# Patient Record
Sex: Female | Born: 1985 | Race: Black or African American | Hispanic: No | Marital: Married | Smoking: Never smoker
Health system: Southern US, Community
[De-identification: ages and names within clinical notes are randomized; demographics above are authoritative.]

## PROBLEM LIST (undated history)

## (undated) DIAGNOSIS — F419 Anxiety disorder, unspecified: Secondary | ICD-10-CM

## (undated) HISTORY — PX: ABDOMINAL HYSTERECTOMY: SHX81

---

## 2015-02-27 ENCOUNTER — Emergency Department: Payer: Self-pay | Admitting: Emergency Medicine

## 2015-04-21 ENCOUNTER — Emergency Department
Admission: EM | Admit: 2015-04-21 | Discharge: 2015-04-21 | Disposition: A | Discharge status: Home / Self Care | Attending: Emergency Medicine | Admitting: Emergency Medicine

## 2015-04-21 ENCOUNTER — Emergency Department

## 2015-04-21 ENCOUNTER — Encounter: Payer: Self-pay | Admitting: *Deleted

## 2015-04-21 DIAGNOSIS — K5900 Constipation, unspecified: Secondary | ICD-10-CM | POA: Insufficient documentation

## 2015-04-21 HISTORY — DX: Anxiety disorder, unspecified: F41.9

## 2015-04-21 LAB — COMPREHENSIVE METABOLIC PANEL
ALBUMIN: 4.2 g/dL (ref 3.5–5.0)
ALK PHOS: 71 U/L (ref 38–126)
ALT: 35 U/L (ref 14–54)
ANION GAP: 6 (ref 5–15)
AST: 43 U/L — ABNORMAL HIGH (ref 15–41)
BILIRUBIN TOTAL: 0.9 mg/dL (ref 0.3–1.2)
BUN: 7 mg/dL (ref 6–20)
CO2: 24 mmol/L (ref 22–32)
Calcium: 8.8 mg/dL — ABNORMAL LOW (ref 8.9–10.3)
Chloride: 108 mmol/L (ref 101–111)
Creatinine, Ser: 0.79 mg/dL (ref 0.44–1.00)
Glucose, Bld: 112 mg/dL — ABNORMAL HIGH (ref 65–99)
POTASSIUM: 3.7 mmol/L (ref 3.5–5.1)
Sodium: 138 mmol/L (ref 135–145)
Total Protein: 8 g/dL (ref 6.5–8.1)

## 2015-04-21 LAB — CBC WITH DIFFERENTIAL/PLATELET
Basophils Absolute: 0 10*3/uL (ref 0–0.1)
Basophils Relative: 1 %
EOS ABS: 0.1 10*3/uL (ref 0–0.7)
EOS PCT: 2 %
HCT: 38.2 % (ref 35.0–47.0)
HEMOGLOBIN: 12.6 g/dL (ref 12.0–16.0)
LYMPHS ABS: 2.8 10*3/uL (ref 1.0–3.6)
Lymphocytes Relative: 51 %
MCH: 27.9 pg (ref 26.0–34.0)
MCHC: 32.9 g/dL (ref 32.0–36.0)
MCV: 84.6 fL (ref 80.0–100.0)
MONOS PCT: 10 %
Monocytes Absolute: 0.6 10*3/uL (ref 0.2–0.9)
Neutro Abs: 2 10*3/uL (ref 1.4–6.5)
Neutrophils Relative %: 36 %
Platelets: 242 10*3/uL (ref 150–440)
RBC: 4.52 MIL/uL (ref 3.80–5.20)
RDW: 14 % (ref 11.5–14.5)
WBC: 5.6 10*3/uL (ref 3.6–11.0)

## 2015-04-21 LAB — URINALYSIS COMPLETE WITH MICROSCOPIC (ARMC ONLY)
BILIRUBIN URINE: NEGATIVE
Glucose, UA: NEGATIVE mg/dL
Hgb urine dipstick: NEGATIVE
Ketones, ur: NEGATIVE mg/dL
Leukocytes, UA: NEGATIVE
Nitrite: NEGATIVE
Protein, ur: NEGATIVE mg/dL
SPECIFIC GRAVITY, URINE: 1.018 (ref 1.005–1.030)
WBC, UA: NONE SEEN WBC/hpf (ref 0–5)
pH: 6 (ref 5.0–8.0)

## 2015-04-21 MED ORDER — MAGNESIUM CITRATE PO SOLN: 1.0000 | Freq: Once | ORAL | Status: DC

## 2015-04-21 NOTE — ED Notes (Signed)
Pt reports she has had some constipation over the past week, associated with vomiting. Today she noticed dark red blood in her stool

## 2015-04-21 NOTE — ED Provider Notes (Signed)
[facility] Emergency Department Provider Note   ____________________________________________  Time seen: 2140  I have reviewed the triage vital signs and the nursing notes.   HISTORY  Chief Complaint Constipation   History limited by: Not Limited   HPI Bethann PunchesJessica D Sylla is a 29 y.o. female resents to the emergency department because of concerns for constipation. She states she has been constipated for the past week. She has been able to pass very small stool. She states that her small stool yesterday had some blood on it. She also has had some nausea and some vomiting. She denies any fevers. She states she has had some issues with constipation and slow bowel in the past but usually is able to control this with supplements.     Past Medical History  Diagnosis Date  . Anxiety     There are no active problems to display for this patient.   Past Surgical History  Procedure Laterality Date  . Abdominal hysterectomy      Current Outpatient Rx  Name  Route  Sig  Dispense  Refill  . magnesium citrate SOLN   Oral   Take 296 mLs (1 Bottle total) by mouth once.   195 mL   0     Allergies Review of patient's allergies indicates no known allergies.  No family history on file.  Social History History  Substance Use Topics  . Smoking status: Never Smoker   . Smokeless tobacco: Not on file  . Alcohol Use: No    Review of Systems  Constitutional: Negative for fever. Cardiovascular: Negative for chest pain. Respiratory: Negative for shortness of breath. Gastrointestinal: Positive for abdominal pain and constipation Genitourinary: Negative for dysuria. Musculoskeletal: Negative for back pain. Skin: Negative for rash. Neurological: Negative for headaches, focal weakness or numbness.   10-point ROS otherwise negative.  ____________________________________________   PHYSICAL EXAM:  VITAL SIGNS: ED Triage Vitals  Enc Vitals Group     BP 04/21/15 1816 123/74 mmHg     Pulse Rate 04/21/15 1816 83     Resp 04/21/15 1816 18     Temp 04/21/15 1816 97.6 F (36.4 C)     Temp Source 04/21/15 1816 Oral     SpO2 04/21/15 1816 99 %     Weight 04/21/15 1816 189 lb (85.73 kg)     Height 04/21/15 1816 5\' 8"  (1.727 m)     Head Cir --      Peak Flow --      Pain Score --      Pain Loc --      Pain Edu? --      Excl. in GC? --      Constitutional: Alert and oriented. Well appearing and in no distress. Eyes: Conjunctivae are normal. PERRL. Normal extraocular movements. ENT   Head: Normocephalic and atraumatic.   Nose: No congestion/rhinnorhea.   Mouth/Throat: Mucous membranes are moist.   Neck: No stridor. Hematological/Lymphatic/Immunilogical: No cervical lymphadenopathy. Cardiovascular: Normal rate, regular rhythm.  No murmurs, rubs, or gallops. Respiratory: Normal respiratory effort without tachypnea nor retractions. Breath sounds are clear and equal bilaterally. No wheezes/rales/rhonchi. Gastrointestinal: Soft and nontender. No distention. There is no CVA tenderness. Rectal exam without any impaction. No gross blood on glove. Genitourinary: Deferred Musculoskeletal: Normal range of motion in all extremities. No joint effusions.  No lower extremity tenderness nor edema. Neurologic:  Normal speech and language. No gross focal neurologic deficits are appreciated. Speech is normal.  Skin:  Skin is warm, dry  and intact. No rash noted. Psychiatric: Mood and affect are normal. Speech and behavior are normal. Patient exhibits appropriate insight and judgment.  ____________________________________________    LABS (pertinent positives/negatives)  Labs Reviewed  COMPREHENSIVE METABOLIC PANEL - Abnormal; Notable for the following:    Glucose, Bld 112 (*)    Calcium 8.8 (*)    AST 43 (*)    All other components within normal limits  URINALYSIS COMPLETEWITH MICROSCOPIC ([facility])  - Abnormal; Notable for the  following:    Color, Urine YELLOW (*)    APPearance CLEAR (*)    Bacteria, UA RARE (*)    Squamous Epithelial / LPF 0-5 (*)    All other components within normal limits  CBC WITH DIFFERENTIAL/PLATELET     ____________________________________________   EKG  None  ____________________________________________    RADIOLOGY  Abd x-ray IMPRESSION: Negative.  ____________________________________________   PROCEDURES  Procedure(s) performed: None  Critical Care performed: No  ____________________________________________   INITIAL IMPRESSION / ASSESSMENT AND PLAN / ED COURSE  Pertinent labs & imaging results that were available during my care of the patient were reviewed by me and considered in my medical decision making (see chart for details).  Patient presents to the emergency department because of concerns for constipation and some bloody stool. On exam patient well-appearing. Abdomen nontender and soft. Rectal exam without any obvious impaction. No gross blood on glove.  Discussed with the patient constipation treatment. Will give prescription for mag citrate and instructed patient she can also use enemas and to use MiraLAX. Discussed roll of fiber.  ____________________________________________   FINAL CLINICAL IMPRESSION(S) / ED DIAGNOSES  Final diagnoses:  Constipation, unspecified constipation type     Phineas SemenGraydon Liylah Najarro, MD 04/21/15 2242

## 2015-04-21 NOTE — Discharge Instructions (Signed)
Please seek medical attention for any high fevers, chest pain, shortness of breath, change in behavior, persistent vomiting, bloody stool or any other new or concerning symptoms.   Constipation Constipation is when a person:  Poops (has a bowel movement) less than 3 times a week.  Has a hard time pooping.  Has poop that is dry, hard, or bigger than normal. HOME CARE   Eat foods with a lot of fiber in them. This includes fruits, vegetables, beans, and whole grains such as brown rice.  Avoid fatty foods and foods with a lot of sugar. This includes french fries, hamburgers, cookies, candy, and soda.  If you are not getting enough fiber from food, take products with added fiber in them (supplements).  Drink enough fluid to keep your pee (urine) clear or pale yellow.  Exercise on a regular basis, or as told by your doctor.  Go to the restroom when you feel like you need to poop. Do not hold it.  Only take medicine as told by your doctor. Do not take medicines that help you poop (laxatives) without talking to your doctor first. GET HELP RIGHT AWAY IF:   You have bright red blood in your poop (stool).  Your constipation lasts more than 4 days or gets worse.  You have belly (abdominal) or butt (rectal) pain.  You have thin poop (as thin as a pencil).  You lose weight, and it cannot be explained. MAKE SURE YOU:   Understand these instructions.  Will watch your condition.  Will get help right away if you are not doing well or get worse. Document Released: 05/16/2008 Document Revised: 12/03/2013 Document Reviewed: 09/09/2013 [facility] Patient Information 2015 [place]. This information is not intended to replace advice given to you by your health care provider. Make sure you discuss any questions you have with your health care provider.

## 2015-04-21 NOTE — ED Notes (Signed)
Pt c/o constipation for one week. Has tried laxatives at home and has not had any relief.

## 2015-06-29 ENCOUNTER — Other Ambulatory Visit: Payer: Self-pay | Admitting: Nurse Practitioner

## 2015-06-29 DIAGNOSIS — R1031 Right lower quadrant pain: Principal | ICD-10-CM

## 2015-06-29 DIAGNOSIS — R1032 Left lower quadrant pain: Principal | ICD-10-CM

## 2015-06-29 DIAGNOSIS — G8929 Other chronic pain: Secondary | ICD-10-CM

## 2015-07-02 ENCOUNTER — Ambulatory Visit

## 2015-07-06 ENCOUNTER — Ambulatory Visit: Admission: RE | Admit: 2015-07-06 | Source: Ambulatory Visit

## 2015-09-12 ENCOUNTER — Emergency Department
Admission: EM | Admit: 2015-09-12 | Discharge: 2015-09-12 | Disposition: A | Discharge status: Home / Self Care | Attending: Emergency Medicine | Admitting: Emergency Medicine

## 2015-09-12 ENCOUNTER — Encounter: Payer: Self-pay | Admitting: Emergency Medicine

## 2015-09-12 DIAGNOSIS — F410 Panic disorder [episodic paroxysmal anxiety] without agoraphobia: Secondary | ICD-10-CM | POA: Insufficient documentation

## 2015-09-12 DIAGNOSIS — F419 Anxiety disorder, unspecified: Secondary | ICD-10-CM

## 2015-09-12 DIAGNOSIS — R079 Chest pain, unspecified: Secondary | ICD-10-CM | POA: Diagnosis present

## 2015-09-12 DIAGNOSIS — Z79899 Other long term (current) drug therapy: Secondary | ICD-10-CM | POA: Insufficient documentation

## 2015-09-12 DIAGNOSIS — F430 Acute stress reaction: Secondary | ICD-10-CM

## 2015-09-12 LAB — CBC
HCT: 38.1 % (ref 35.0–47.0)
Hemoglobin: 12.6 g/dL (ref 12.0–16.0)
MCH: 28 pg (ref 26.0–34.0)
MCHC: 33.2 g/dL (ref 32.0–36.0)
MCV: 84.5 fL (ref 80.0–100.0)
PLATELETS: 278 10*3/uL (ref 150–440)
RBC: 4.51 MIL/uL (ref 3.80–5.20)
RDW: 13.7 % (ref 11.5–14.5)
WBC: 4.2 10*3/uL (ref 3.6–11.0)

## 2015-09-12 LAB — TROPONIN I

## 2015-09-12 LAB — BASIC METABOLIC PANEL
Anion gap: 4 — ABNORMAL LOW (ref 5–15)
BUN: 9 mg/dL (ref 6–20)
CALCIUM: 8.9 mg/dL (ref 8.9–10.3)
CO2: 24 mmol/L (ref 22–32)
CREATININE: 0.78 mg/dL (ref 0.44–1.00)
Chloride: 108 mmol/L (ref 101–111)
GFR calc Af Amer: >60 mL/min (ref >60)
GLUCOSE: 99 mg/dL (ref 65–99)
Potassium: 4 mmol/L (ref 3.5–5.1)
Sodium: 136 mmol/L (ref 135–145)

## 2015-09-12 MED ORDER — HYDROXYZINE HCL 50 MG PO TABS: 50.0000 mg | ORAL_TABLET | Freq: Every evening | ORAL | Status: AC | PRN

## 2015-09-12 NOTE — ED Notes (Signed)
Pt reports anxiety. States she feel that her thoughts are racing and feels she is in a "haze". Pt states she is having random crying bouts. No acute distress noted at present. Husband present at bedside.

## 2015-09-12 NOTE — ED Notes (Signed)
Pt states approx 1 week she has been having intermittent chest/back pain, as well as increased anxiety. Pt calm and cooperative in triage. NAD noted at this time.

## 2015-09-12 NOTE — ED Provider Notes (Signed)
[facility] Emergency Department Provider Note  ____________________________________________  Time seen: Approximately 1:29 PM  I have reviewed the triage vital signs and the nursing notes.   HISTORY  Chief Complaint Anxiety and Chest Pain    HPI Ashlee Estrada is a 29 y.o. female resistance the emergency department complaining of chest/back pain, increased respiratory rate, increased anxiety. Upon interview with the provider the patient said that most of her symptoms had subsided. She states that she has had intermittent history of same symptoms over the past week to 10 days. She endorses a history of anxiety was treated with Cymbalta but states that she self discontinued medication approximately 30 days ago. She states that her symptoms have been manageable to this time. However, she states that she has a known trigger that is present in her social life that is causing increase in anxiety. She states that she has an appointment to follow-up with her primary care provider but the symptoms were increasing to the point that she "needed help at this time." Patient states that the chest pain is described best as a tightness sensation.   Past Medical History  Diagnosis Date  . Anxiety     There are no active problems to display for this patient.   Past Surgical History  Procedure Laterality Date  . Abdominal hysterectomy      Current Outpatient Rx  Name  Route  Sig  Dispense  Refill  . hydrOXYzine (ATARAX/VISTARIL) 50 MG tablet   Oral   Take 1 tablet (50 mg total) by mouth at bedtime as needed for anxiety.   10 tablet   0   . magnesium citrate SOLN   Oral   Take 296 mLs (1 Bottle total) by mouth once.   195 mL   0     Allergies Review of patient's allergies indicates no known allergies.  History reviewed. No pertinent family history.  Social History Social History  Substance Use Topics  . Smoking status: Never Smoker   . Smokeless  tobacco: None  . Alcohol Use: No    Review of Systems Constitutional: No fever/chills Eyes: No visual changes. ENT: No sore throat. Cardiovascular: Endorses chest pain/tightness. Respiratory: Denies shortness of breath. She endorses increased respiratory rate. Gastrointestinal: No abdominal pain.  No nausea, no vomiting.  No diarrhea.  No constipation. Genitourinary: Negative for dysuria. Musculoskeletal: Negative for back pain. Skin: Negative for rash. Neurological: Negative for headaches, focal weakness or numbness.  10-point ROS otherwise negative.  ____________________________________________   PHYSICAL EXAM:  VITAL SIGNS: ED Triage Vitals  Enc Vitals Group     BP 09/12/15 1037 107/88 mmHg     Pulse Rate 09/12/15 1037 78     Resp 09/12/15 1037 18     Temp 09/12/15 1037 98.3 F (36.8 C)     Temp Source 09/12/15 1037 Oral     SpO2 09/12/15 1037 95 %     Weight 09/12/15 1037 179 lb (81.194 kg)     Height 09/12/15 1037  (1.727 m)     Head Cir --      Peak Flow --      Pain Score 09/12/15 1038 8     Pain Loc --      Pain Edu? --      Excl. in GC? --     Constitutional: Alert and oriented. Well appearing and in no acute distress. Eyes: Conjunctivae are normal. PERRL. EOMI. Head: Atraumatic. Nose: No congestion/rhinnorhea. Mouth/Throat: Mucous membranes are moist.  Oropharynx non-erythematous. Neck: No stridor.   Cardiovascular: Normal rate, regular rhythm. Grossly normal heart sounds.  Good peripheral circulation. Respiratory: Normal respiratory effort.  No retractions. Lungs CTAB. Gastrointestinal: Soft and nontender. No distention. No abdominal bruits. No CVA tenderness. Musculoskeletal: No lower extremity tenderness nor edema.  No joint effusions. Neurologic:  Normal speech and language. No gross focal neurologic deficits are appreciated. No gait instability. Skin:  Skin is warm, dry and intact. No rash noted. Psychiatric: Mood and affect are normal.  Speech and behavior are normal.  ____________________________________________   LABS (all labs ordered are listed, but only abnormal results are displayed)  Labs Reviewed  BASIC METABOLIC PANEL - Abnormal; Notable for the following:    Anion gap 4 (*)    All other components within normal limits  CBC  TROPONIN I   ____________________________________________  EKG  EKG: Normal sinus rhythm. No elevations or depressions noted. PR, QRS, QT intervals within normal limits. No Q waves or delta waves present. ____________________________________________  RADIOLOGY   ____________________________________________   PROCEDURES  Procedure(s) performed: None  Critical Care performed: No  ____________________________________________   INITIAL IMPRESSION / ASSESSMENT AND PLAN / ED COURSE  Pertinent labs & imaging results that were available during my care of the patient were reviewed by me and considered in my medical decision making (see chart for details).  The patient is a 29 year old female who presented to the emergency department with chest pain, tachypnea, and anxiety. A timeout provider saw the patient also symptoms had resolved. Laboratory, EKG, and exam findings are most consistent with a panic attack. Advised patient of diagnosis. She agrees with same. Patient advises that hydroxyzine was given the last time she had these increased symptoms that were worked well for her. She does not want to restart her Cymbalta until she sees her primary care provider. He also declines Ativan. We'll prescribe hydroxyzine until patient can see primary care appointment within the next week. Patient understands diagnosis and treatment plan. Verbalizes consent. Verbalizes compliance. ____________________________________________   FINAL CLINICAL IMPRESSION(S) / ED DIAGNOSES  Final diagnoses:  Panic attack as reaction to stress  Anxiety      Racheal Patches, PA-C 09/12/15  1603  Emily Filbert, MD 09/13/15 1540

## 2015-09-12 NOTE — ED Notes (Signed)
NAD noted at time of D/C. Pt denies questions or concerns. Pt ambulatory to the lobby at this time.  

## 2015-09-12 NOTE — Discharge Instructions (Signed)

## 2015-11-17 ENCOUNTER — Encounter: Payer: Self-pay | Admitting: Emergency Medicine

## 2015-11-17 DIAGNOSIS — K8020 Calculus of gallbladder without cholecystitis without obstruction: Secondary | ICD-10-CM | POA: Insufficient documentation

## 2015-11-17 DIAGNOSIS — Z79899 Other long term (current) drug therapy: Secondary | ICD-10-CM | POA: Insufficient documentation

## 2015-11-17 DIAGNOSIS — Z3202 Encounter for pregnancy test, result negative: Secondary | ICD-10-CM | POA: Insufficient documentation

## 2015-11-17 DIAGNOSIS — R109 Unspecified abdominal pain: Secondary | ICD-10-CM | POA: Diagnosis present

## 2015-11-17 LAB — CBC
HCT: 37.1 % (ref 35.0–47.0)
Hemoglobin: 12.1 g/dL (ref 12.0–16.0)
MCH: 27.5 pg (ref 26.0–34.0)
MCHC: 32.6 g/dL (ref 32.0–36.0)
MCV: 84.6 fL (ref 80.0–100.0)
PLATELETS: 254 10*3/uL (ref 150–440)
RBC: 4.39 MIL/uL (ref 3.80–5.20)
RDW: 14.3 % (ref 11.5–14.5)
WBC: 6.4 10*3/uL (ref 3.6–11.0)

## 2015-11-17 LAB — COMPREHENSIVE METABOLIC PANEL
ALT: 16 U/L (ref 14–54)
AST: 21 U/L (ref 15–41)
Albumin: 4.2 g/dL (ref 3.5–5.0)
Alkaline Phosphatase: 65 U/L (ref 38–126)
Anion gap: 4 — ABNORMAL LOW (ref 5–15)
BUN: 8 mg/dL (ref 6–20)
CO2: 26 mmol/L (ref 22–32)
CREATININE: 0.83 mg/dL (ref 0.44–1.00)
Calcium: 8.9 mg/dL (ref 8.9–10.3)
Chloride: 107 mmol/L (ref 101–111)
GFR calc Af Amer: >60 mL/min (ref >60)
Glucose, Bld: 99 mg/dL (ref 65–99)
Potassium: 3.8 mmol/L (ref 3.5–5.1)
Sodium: 137 mmol/L (ref 135–145)
TOTAL PROTEIN: 7.6 g/dL (ref 6.5–8.1)
Total Bilirubin: 0.4 mg/dL (ref 0.3–1.2)

## 2015-11-17 LAB — URINALYSIS COMPLETE WITH MICROSCOPIC (ARMC ONLY)
BILIRUBIN URINE: NEGATIVE
Glucose, UA: NEGATIVE mg/dL
HGB URINE DIPSTICK: NEGATIVE
KETONES UR: NEGATIVE mg/dL
LEUKOCYTES UA: NEGATIVE
NITRITE: NEGATIVE
PH: 5 (ref 5.0–8.0)
Protein, ur: NEGATIVE mg/dL
RBC / HPF: NONE SEEN RBC/hpf (ref 0–5)
Specific Gravity, Urine: 1.015 (ref 1.005–1.030)

## 2015-11-17 LAB — POCT PREGNANCY, URINE: PREG TEST UR: NEGATIVE

## 2015-11-17 LAB — LIPASE, BLOOD: Lipase: 35 U/L (ref 11–51)

## 2015-11-17 NOTE — ED Notes (Signed)
Pt presents to ED with c/o left flank pain since yesterday with nausea and feels bloated. Last bowl movements yesterday. No urinary symptoms. Pt alerts and oriented x4 at this time.

## 2015-11-18 ENCOUNTER — Emergency Department
Admission: EM | Admit: 2015-11-18 | Discharge: 2015-11-18 | Disposition: A | Discharge status: Home / Self Care | Attending: Emergency Medicine | Admitting: Emergency Medicine

## 2015-11-18 ENCOUNTER — Emergency Department

## 2015-11-18 DIAGNOSIS — K8020 Calculus of gallbladder without cholecystitis without obstruction: Secondary | ICD-10-CM

## 2015-11-18 DIAGNOSIS — R109 Unspecified abdominal pain: Secondary | ICD-10-CM

## 2015-11-18 MED ORDER — ONDANSETRON HCL 4 MG/2ML IJ SOLN
4.0000 mg | Freq: Once | INTRAMUSCULAR | Status: AC
  Administered 2015-11-18: 4 mg via INTRAVENOUS
  Filled 2015-11-18: qty 2

## 2015-11-18 MED ORDER — KETOROLAC TROMETHAMINE 30 MG/ML IJ SOLN
30.0000 mg | Freq: Once | INTRAMUSCULAR | Status: AC
  Administered 2015-11-18: 30 mg via INTRAVENOUS
  Filled 2015-11-18: qty 1

## 2015-11-18 NOTE — ED Notes (Signed)
Discharge instructions given to pt.  Verbalized understanding.  No questions or concerns at this time.  Pt in NAD.  Items with pt upon discharge.  No items left in room.

## 2015-11-18 NOTE — ED Provider Notes (Signed)
[facility] Provider Note  ____________________________________________  Time seen: 12:15AM  I have reviewed the triage vital signs and the nursing notes.   HISTORY  Chief Complaint Abdominal Pain     HPI Ashlee Estrada is a 29 y.o. female presents with complaint of left flank pain since yesterday accompanied by nausea and abdominal bloating. Patient states last bowel movement was yesterday. Patient denies any fever no diarrhea no dysuria or hematuria. Patient does however admit to nausea however no vomiting. Patient states current pain score is 8 out of 10    Past Medical History  Diagnosis Date  . Anxiety     There are no active problems to display for this patient.   Past Surgical History  Procedure Laterality Date  . Abdominal hysterectomy      Current Outpatient Rx  Name  Route  Sig  Dispense  Refill  . hydrOXYzine (ATARAX/VISTARIL) 50 MG tablet   Oral   Take 1 tablet (50 mg total) by mouth at bedtime as needed for anxiety.   10 tablet   0   . magnesium citrate SOLN   Oral   Take 296 mLs (1 Bottle total) by mouth once.   195 mL   0     Allergies Review of patient's allergies indicates no known allergies.  History reviewed. No pertinent family history.  Social History Social History  Substance Use Topics  . Smoking status: Never Smoker   . Smokeless tobacco: None  . Alcohol Use: No    Review of Systems  Constitutional: Negative for fever. Eyes: Negative for visual changes. ENT: Negative for sore throat. Cardiovascular: Negative for chest pain. Respiratory: Negative for shortness of breath. Gastrointestinal: Negative for abdominal pain, vomiting and diarrhea. Genitourinary: Negative for dysuria. Musculoskeletal: Negative for back pain. Skin: Negative for rash. Neurological: Negative for headaches, focal weakness or numbness.   10-point ROS otherwise  negative.  ____________________________________________   PHYSICAL EXAM:  VITAL SIGNS: ED Triage Vitals  Enc Vitals Group     BP 11/17/15 2134 137/87 mmHg     Pulse Rate 11/17/15 2134 88     Resp 11/17/15 2134 18     Temp 11/17/15 2134 97.8 F (36.6 C)     Temp Source 11/17/15 2134 Oral     SpO2 11/17/15 2135 100 %     Weight --      Height --      Head Cir --      Peak Flow --      Pain Score 11/17/15 2135 8     Pain Loc --      Pain Edu? --      Excl. in GC? --     Constitutional: Alert and oriented. Well appearing and in no distress. Eyes: Conjunctivae are normal. PERRL. Normal extraocular movements. ENT   Head: Normocephalic and atraumatic.   Nose: No congestion/rhinnorhea.   Mouth/Throat: Mucous membranes are moist.   Neck: No stridor. Hematological/Lymphatic/Immunilogical: No cervical lymphadenopathy. Cardiovascular: Normal rate, regular rhythm. Normal and symmetric distal pulses are present in all extremities. No murmurs, rubs, or gallops. Respiratory: Normal respiratory effort without tachypnea nor retractions. Breath sounds are clear and equal bilaterally. No wheezes/rales/rhonchi. Gastrointestinal: Right upper quadrant/lower quadrant pain to palpation. No distention. There is no CVA tenderness. Genitourinary: deferred Musculoskeletal: Nontender with normal range of motion in all extremities. No joint effusions.  No lower extremity tenderness nor edema. Neurologic:  Normal speech and language. No gross focal neurologic deficits are appreciated.  Speech is normal.  Skin:  Skin is warm, dry and intact. No rash noted. Psychiatric: Mood and affect are normal. Speech and behavior are normal. Patient exhibits appropriate insight and judgment.  ____________________________________________    LABS (pertinent positives/negatives)  Labs Reviewed  COMPREHENSIVE METABOLIC PANEL - Abnormal; Notable for the following:    Anion gap 4 (*)    All other  components within normal limits  URINALYSIS COMPLETEWITH MICROSCOPIC ([facility] ONLY) - Abnormal; Notable for the following:    Color, Urine YELLOW (*)    APPearance CLEAR (*)    Bacteria, UA RARE (*)    Squamous Epithelial / LPF 0-5 (*)    All other components within normal limits  LIPASE, BLOOD  CBC  POCT PREGNANCY, URINE  POC URINE PREG, ED       RADIOLOGY  CT RENAL STONE STUDY (Final result) Result time: 11/18/15 01:52:25   Final result by Rad Results In Interface (11/18/15 01:52:25)   Narrative:   CLINICAL DATA: Acute onset of left flank pain and nausea. Abdominal bloating. Initial encounter.  EXAM: CT ABDOMEN AND PELVIS WITHOUT CONTRAST  TECHNIQUE: Multidetector CT imaging of the abdomen and pelvis was performed following the standard protocol without IV contrast.  COMPARISON: Abdominal radiograph performed 04/21/2015  FINDINGS: The visualized lung bases are clear.  The liver and spleen are unremarkable in appearance. Multiple large stones are noted within the gallbladder. The gallbladder is otherwise unremarkable. The pancreas and adrenal glands are unremarkable.  The kidneys are unremarkable in appearance. There is no evidence of hydronephrosis. No renal or ureteral stones are seen. No perinephric stranding is appreciated.  No free fluid is identified. The small bowel is unremarkable in appearance. The stomach is within normal limits. No acute vascular abnormalities are seen.  The appendix is normal in caliber, without evidence of appendicitis. The colon is unremarkable in appearance.  The bladder is decompressed and not well assessed. Trace free fluid within the pelvis is likely physiologic in nature. The patient is status post partial hysterectomy. The right ovary is grossly unremarkable. No suspicious adnexal masses are seen. No inguinal lymphadenopathy is seen.  No acute osseous abnormalities are identified.  IMPRESSION: 1. No acute abnormality  seen within the abdomen or pelvis. 2. Cholelithiasis. Gallbladder otherwise unremarkable.   Electronically Signed By: Roanna Raider M.D. On: 11/18/2015 01:52        INITIAL IMPRESSION / ASSESSMENT AND PLAN / ED COURSE  Pertinent labs & imaging results that were available during my care of the patient were reviewed by me and considered in my medical decision making (see chart for details).  Patient discomfort resolved following IV Toradol and Zofran.  ____________________________________________   FINAL CLINICAL IMPRESSION(S) / ED DIAGNOSES  Final diagnoses:  Left flank pain  Gallstones      Darci Current, MD 11/18/15 0222

## 2015-11-18 NOTE — Discharge Instructions (Signed)

## 2017-08-21 IMAGING — CT CT RENAL STONE PROTOCOL
1 of 3 series · 3 of 36 positions shown, 7 images · non-contrast
Comparison: Abdominal radiograph performed 04/21/2015

CLINICAL DATA: Acute onset of left flank pain and nausea. Abdominal
bloating. Initial encounter.

EXAM:
CT ABDOMEN AND PELVIS WITHOUT CONTRAST
TECHNIQUE: Multidetector CT imaging of the abdomen and pelvis was performed
following the standard protocol without IV contrast.

[Series 4: lung windows · axial · 0.69mm/px · z∈[-480,-440]mm · 3 of 18 slices shown, 7 images]
[im 5/18  soft-tissue]
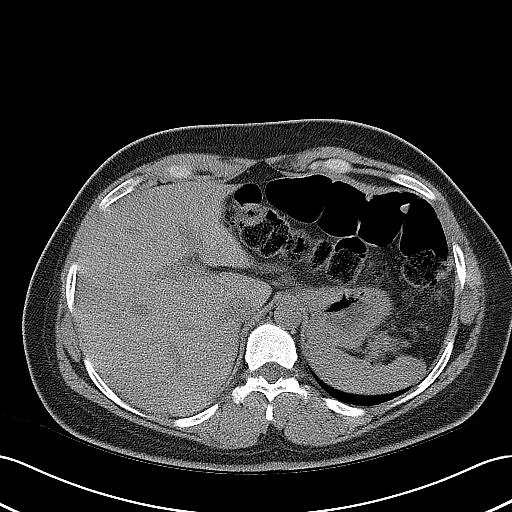
[im 5/18  lung]
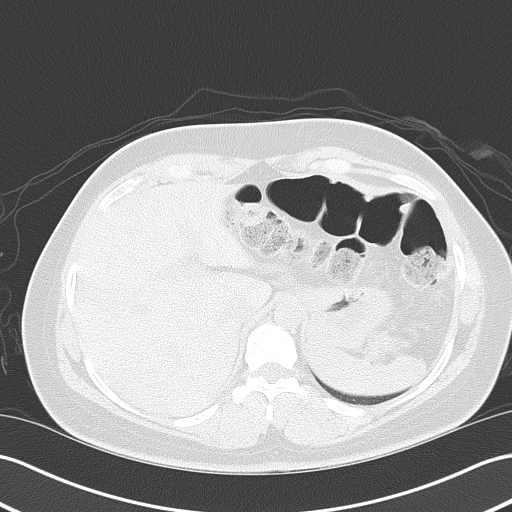
[im 5/18  bone]
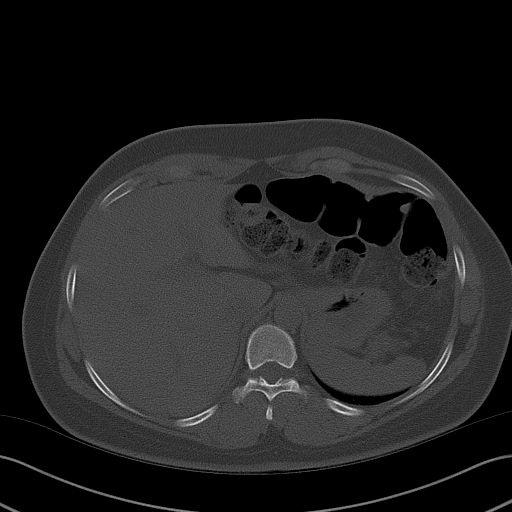
[im 9/18  soft-tissue]
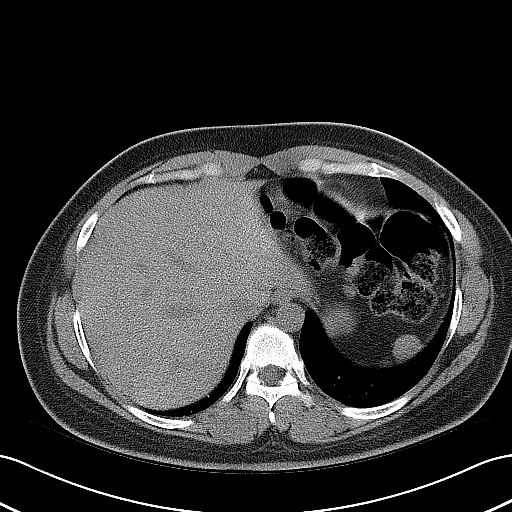
[im 9/18  lung]
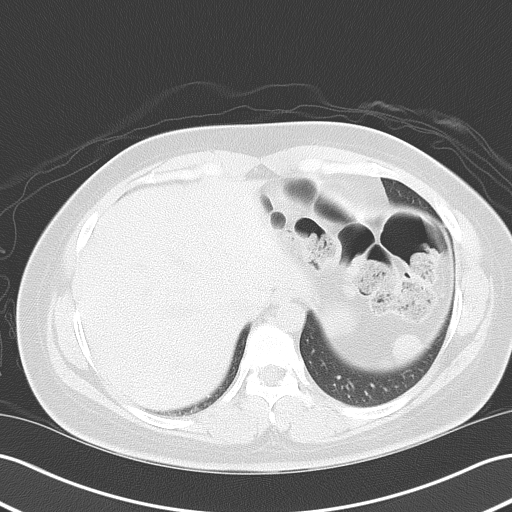
[im 13/18  soft-tissue]
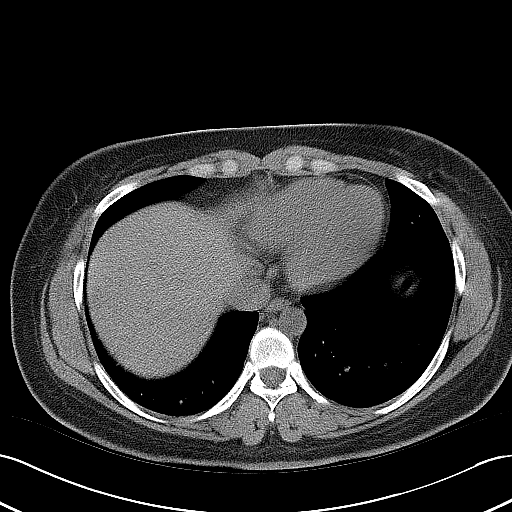
[im 13/18  lung]
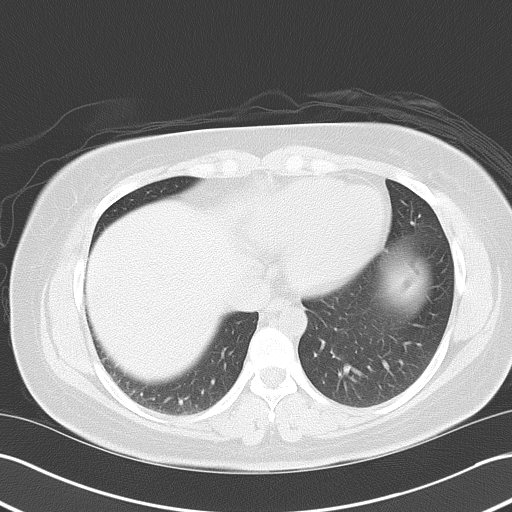

[3 of 36 positions shown; findings below may reference images not displayed]

FINDINGS: The visualized lung bases are clear.

The liver and spleen are unremarkable in appearance. Multiple large
stones are noted within the gallbladder. The gallbladder is
otherwise unremarkable. The pancreas and adrenal glands are
unremarkable.

The kidneys are unremarkable in appearance. There is no evidence of
hydronephrosis. No renal or ureteral stones are seen. No perinephric
stranding is appreciated.

No free fluid is identified. The small bowel is unremarkable in
appearance. The stomach is within normal limits. No acute vascular
abnormalities are seen.

The appendix is normal in caliber, without evidence of appendicitis.
The colon is unremarkable in appearance.

The bladder is decompressed and not well assessed. Trace free fluid
within the pelvis is likely physiologic in nature. The patient is
status post partial hysterectomy. The right ovary is grossly
unremarkable. No suspicious adnexal masses are seen. No inguinal
lymphadenopathy is seen.

No acute osseous abnormalities are identified.
IMPRESSION: 1. No acute abnormality seen within the abdomen or pelvis.
2. Cholelithiasis.  Gallbladder otherwise unremarkable.
